# Patient Record
Sex: Female | Born: 1955 | Race: Black or African American | Hispanic: No | Marital: Married | Smoking: Former smoker
Health system: Southern US, Community
[De-identification: ages and names within clinical notes are randomized; demographics above are authoritative.]

## PROBLEM LIST (undated history)

## (undated) DIAGNOSIS — I10 Essential (primary) hypertension: Secondary | ICD-10-CM

## (undated) HISTORY — PX: TUBAL LIGATION: SHX77

## (undated) HISTORY — DX: Essential (primary) hypertension: I10

---

## 2017-01-07 ENCOUNTER — Encounter: Payer: Self-pay | Admitting: *Deleted

## 2017-01-11 ENCOUNTER — Ambulatory Visit (INDEPENDENT_AMBULATORY_CARE_PROVIDER_SITE_OTHER): Payer: Self-pay | Admitting: Obstetrics & Gynecology

## 2017-01-11 ENCOUNTER — Encounter: Payer: Self-pay | Admitting: Obstetrics & Gynecology

## 2017-01-11 ENCOUNTER — Encounter (INDEPENDENT_AMBULATORY_CARE_PROVIDER_SITE_OTHER): Payer: Self-pay

## 2017-01-11 VITALS — BP 100/60 | HR 66 | Wt 189.0 lb

## 2017-01-11 DIAGNOSIS — N9089 Other specified noninflammatory disorders of vulva and perineum: Secondary | ICD-10-CM

## 2017-01-11 NOTE — Progress Notes (Signed)
      Chief Complaint  Patient presents with  . Referral    vaginal lesion    Blood pressure 100/60, pulse 66, weight 189 lb (85.7 kg).  61 y.o. G2P2 No LMP recorded. The current method of family planning is tubal ligation + menopause.  Outpatient Encounter Prescriptions as of 01/11/2017  Medication Sig  . lisinopril-hydrochlorothiazide (PRINZIDE,ZESTORETIC) 20-25 MG tablet Take 1 tablet by mouth daily.   No facility-administered encounter medications on file as of 01/11/2017.     Subjective Martha Li presents today with about a 3 week history of a left vaginal or vulvar lesion that was examined prior primary care doctor, Dr. Alvina Filbert She states that it hurts so bad that she hardly could walk She's had no drainage and never had a lesion like it before She is not diabetic Patient states that it didn't yet she didn't burn it was just tender  Objective General WDWN female NAD Vulva:  normal appearing vulva with no masses, tenderness or lesions, no swelling erythema tenderness or leukoplakia is seen on exam today I had the patient examined area herself and she said whatever Ariel it was involved has now resolved Vagina:  normal mucosa, no discharge Cervix:  no lesions Uterus:  normal size, contour, position, consistency, mobility, non-tender Adnexa: ovaries:present,     Pertinent ROS No burning with urination, frequency or urgency No nausea, vomiting or diarrhea Nor fever chills or other constitutional symptoms   Labs or studies No new     Impression Diagnoses this Encounter::   ICD-10-CM   1. Vulvar lesion N90.89    resolved on exam today    Established relevant diagnosis(es):   Plan/Recommendations: No orders of the defined types were placed in this encounter.   Labs or Scans Ordered: No orders of the defined types were placed in this encounter.   Management:: Patient instructed if it returns to please call and we will see her soon as  possible She has any other issues that need evaluation she is certainly welcome to give [place] a call  Follow up Return if symptoms worsen or fail to improve.     All questions were answered.  Past Medical History:  Diagnosis Date  . Hypertension     Past Surgical History:  Procedure Laterality Date  . TUBAL LIGATION      OB History    Gravida Para Term Preterm AB Living   2 2           SAB TAB Ectopic Multiple Live Births                  No Known Allergies  Social History   Social History  . Marital status: Married    Spouse name: N/A  . Number of children: N/A  . Years of education: N/A   Social History Main Topics  . Smoking status: Former Games developer  . Smokeless tobacco: Former Neurosurgeon  . Alcohol use No  . Drug use: No  . Sexual activity: Not Currently   Other Topics Concern  . None   Social History Narrative  . None    Family History  Problem Relation Age of Onset  . Hypertension Father   . Hypertension Mother   . Diabetes Mother   . Hypertension Brother   . Hypertension Sister

## 2018-07-16 ENCOUNTER — Ambulatory Visit: Payer: Self-pay

## 2018-12-09 ENCOUNTER — Other Ambulatory Visit: Payer: Self-pay

## 2018-12-10 ENCOUNTER — Encounter: Payer: Self-pay | Admitting: *Deleted

## 2018-12-10 ENCOUNTER — Other Ambulatory Visit: Payer: Self-pay | Admitting: *Deleted

## 2018-12-10 ENCOUNTER — Other Ambulatory Visit: Payer: Self-pay

## 2018-12-10 ENCOUNTER — Ambulatory Visit: Payer: Self-pay | Attending: Oncology | Admitting: *Deleted

## 2018-12-10 ENCOUNTER — Ambulatory Visit
Admission: RE | Admit: 2018-12-10 | Discharge: 2018-12-10 | Disposition: A | Discharge status: Home / Self Care | Payer: Self-pay | Source: Ambulatory Visit | Attending: Oncology | Admitting: Oncology

## 2018-12-10 VITALS — BP 145/86 | HR 65 | Temp 96.6°F | Ht 66.0 in | Wt 196.0 lb

## 2018-12-10 DIAGNOSIS — Z0000 Encounter for general adult medical examination without abnormal findings: Secondary | ICD-10-CM

## 2018-12-10 DIAGNOSIS — N63 Unspecified lump in unspecified breast: Secondary | ICD-10-CM

## 2018-12-10 NOTE — Progress Notes (Signed)
  Subjective:     Patient ID: Martha Li, female   DOB: May 22, 1955, 63 y.o.   MRN: [id number]  HPI   Review of Systems     Objective:   Physical Exam Chest:     Breasts:        Right: No swelling, bleeding, inverted nipple, mass, nipple discharge, skin change or tenderness.        Left: No swelling, bleeding, inverted nipple, mass, nipple discharge, skin change or tenderness.    Lymphadenopathy:     Upper Body:     Right upper body: No supraclavicular or axillary adenopathy.     Left upper body: No supraclavicular or axillary adenopathy.        Assessment:     63 year old Black female presents to [facility] via referral from [facility] for clinical breast exam and mammogram.  Last pap was in 2018. Next pap due per results.  Clinical breast exam unremarkable.  Taught self breast awareness.  This will be the patient's first mammogram.  Her sister has been diagnosed with breast cancer.  Patient has been screened for eligibility.  She does not have any insurance, Medicare or Medicaid.  She also meets financial eligibility.  Hand-out given on the Affordable Care Act.  Risk Assessment    Risk Scores      12/10/2018   Last edited by: Theodore Demark, RN   5-year risk: 2.6 %   Lifetime risk: 9.7 %            Plan:     Screenigng mammogram ordered.  Will follow up per [facility] protocol.

## 2019-01-08 ENCOUNTER — Ambulatory Visit: Payer: Self-pay

## 2019-01-15 ENCOUNTER — Ambulatory Visit
Admission: RE | Admit: 2019-01-15 | Discharge: 2019-01-15 | Disposition: A | Discharge status: Home / Self Care | Payer: Self-pay | Source: Ambulatory Visit | Attending: Oncology | Admitting: Oncology

## 2019-01-15 DIAGNOSIS — N63 Unspecified lump in unspecified breast: Secondary | ICD-10-CM

## 2019-01-16 ENCOUNTER — Other Ambulatory Visit: Payer: Self-pay

## 2019-01-16 DIAGNOSIS — R920 Mammographic microcalcification found on diagnostic imaging of breast: Secondary | ICD-10-CM

## 2019-01-30 ENCOUNTER — Encounter: Payer: Self-pay | Admitting: *Deleted

## 2019-01-30 NOTE — Progress Notes (Signed)
Patient has not been scheduled for her breast biopsy.  I called patient and she did not think she needed to have anything else done.  She agrees to have a biopsy.  I left a message with Aldona Bar in the breast center to call and schedule the patient.  I also gave the patient the number to the breast center.

## 2019-03-18 ENCOUNTER — Encounter: Payer: Self-pay | Admitting: *Deleted

## 2019-03-18 NOTE — Progress Notes (Signed)
Patient did not schedule her breast biopsy after our last conversation.  The [facility] did send a certified letter to the patient on 02/23/19.  Will close case as refusal to follow up.  I will also send her primary care provider a message that the patient never had her breast biopsy.

## 2020-07-19 IMAGING — US US BREAST*L* LIMITED INC AXILLA
1 series · 12 of 12 positions shown · non-contrast
Comparison: Previous exam(s).

CLINICAL DATA: Screening recall for right breast asymmetry with
calcifications, additional calcifications in the central posterior
right breast as well as 2 masses in the left breast.

EXAM:
DIGITAL DIAGNOSTIC BILATERAL MAMMOGRAM WITH CAD AND TOMO
BILATERAL BREAST ULTRASOUND

[Series 1: us breast*left* limited inc axilla · 0.06mm/px · 12 of 12 slices shown]
[im 1/12]
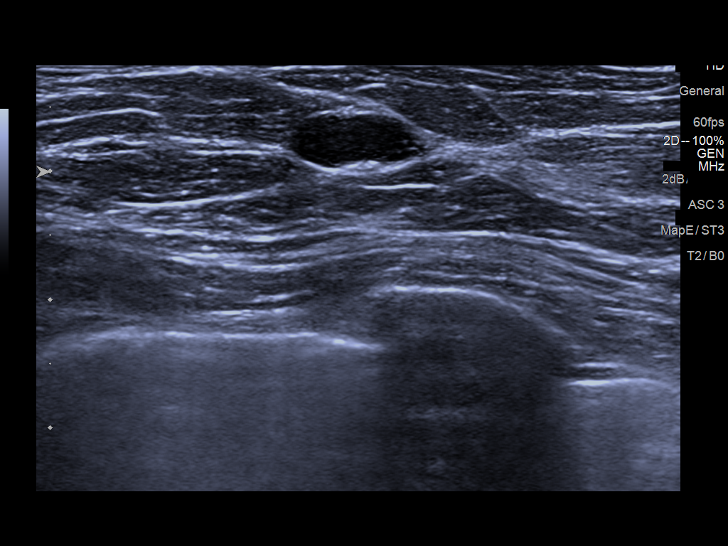
[im 2/12]
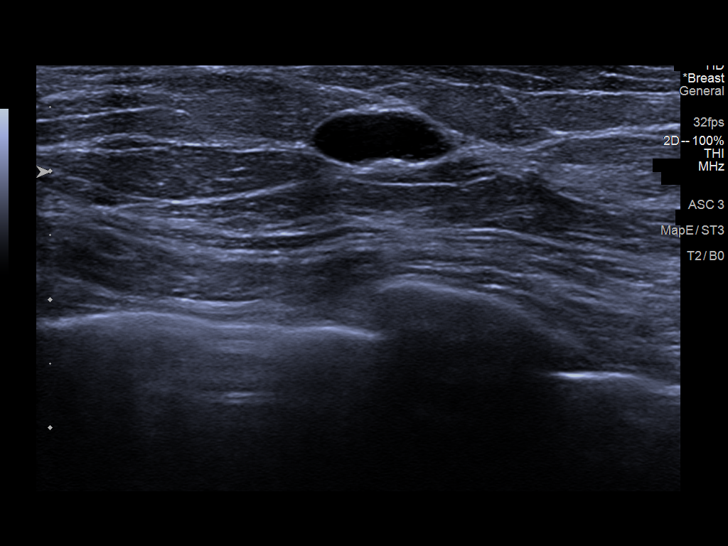
[im 3/12]
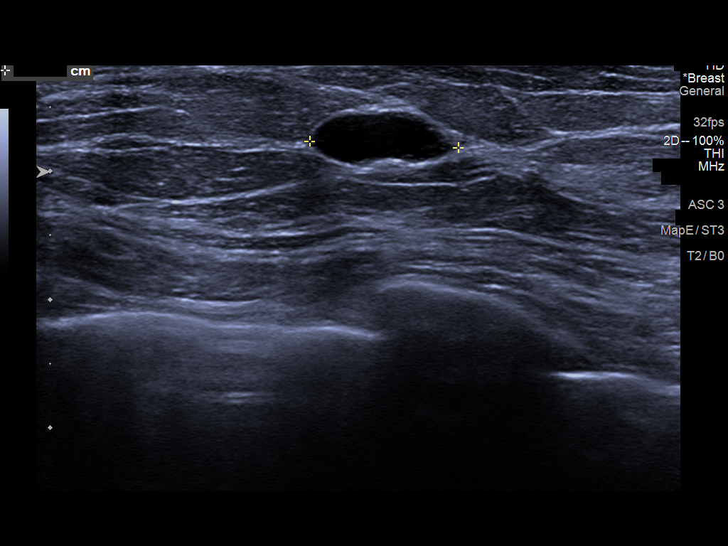
[im 4/12]
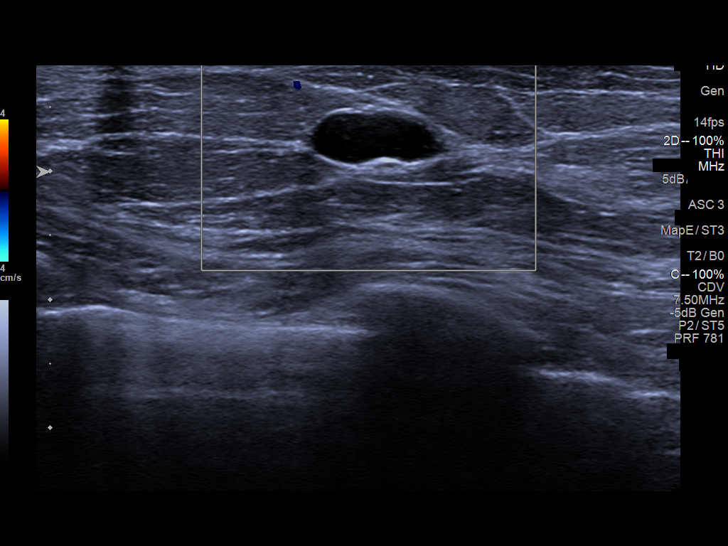
[im 5/12]
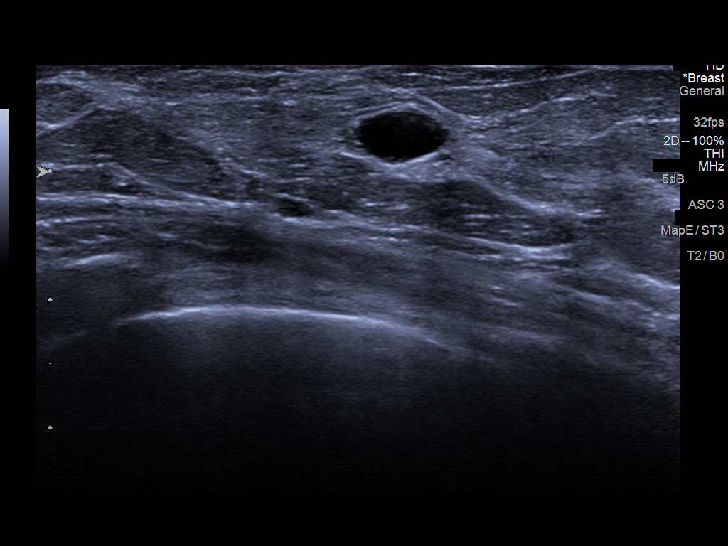
[im 6/12]
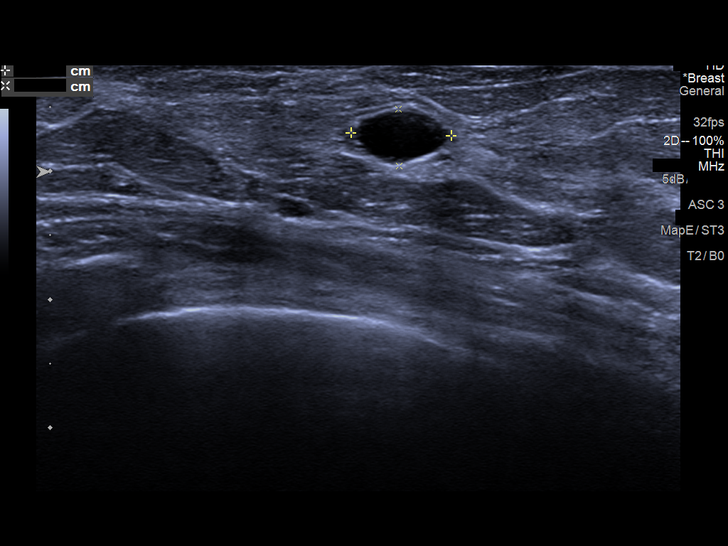
[im 7/12]
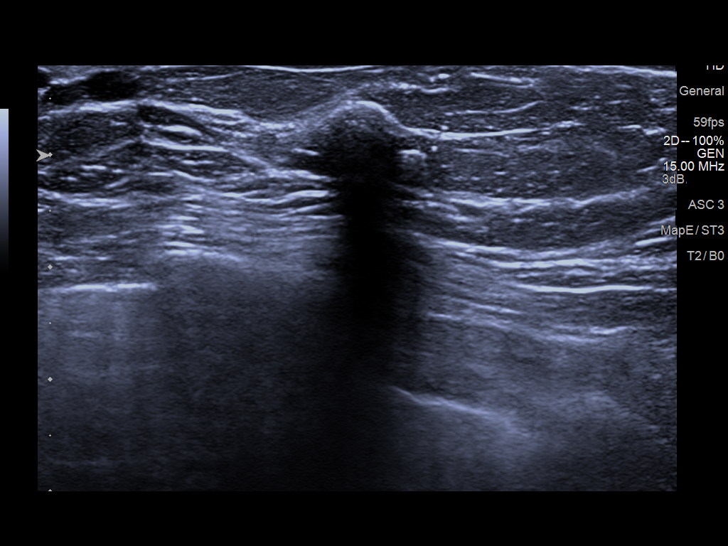
[im 8/12]
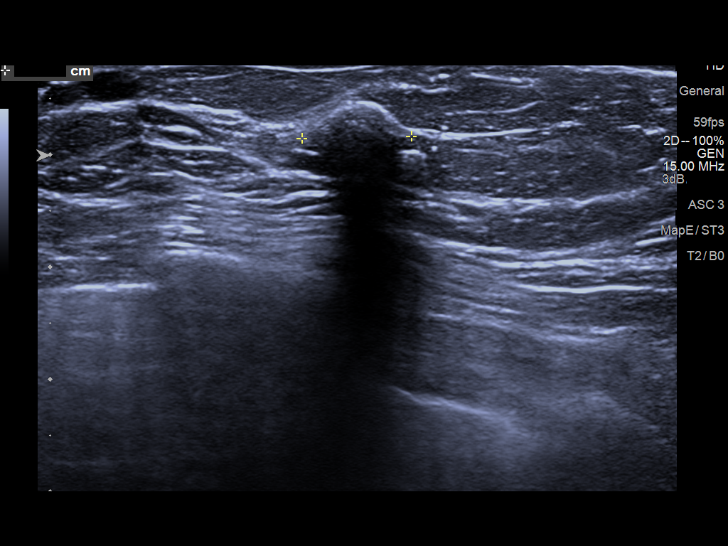
[im 9/12]
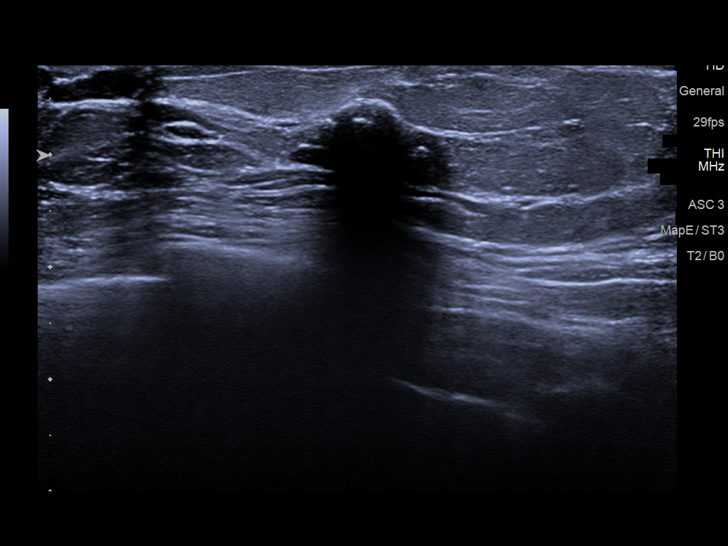
[im 10/12]
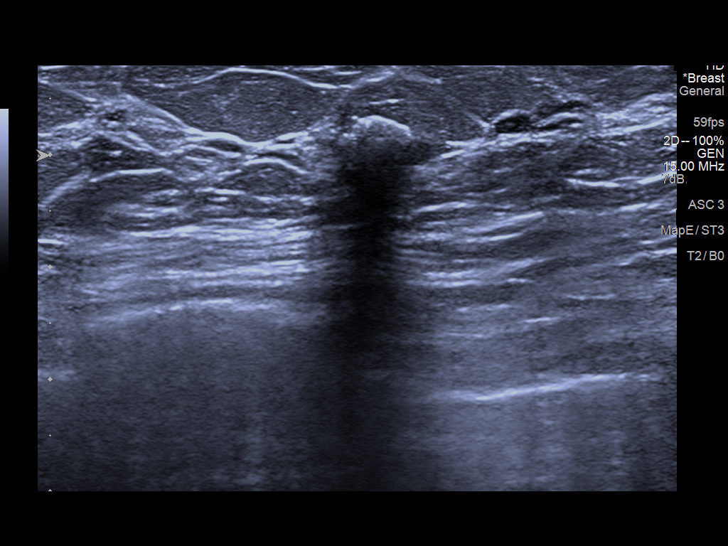
[im 11/12]
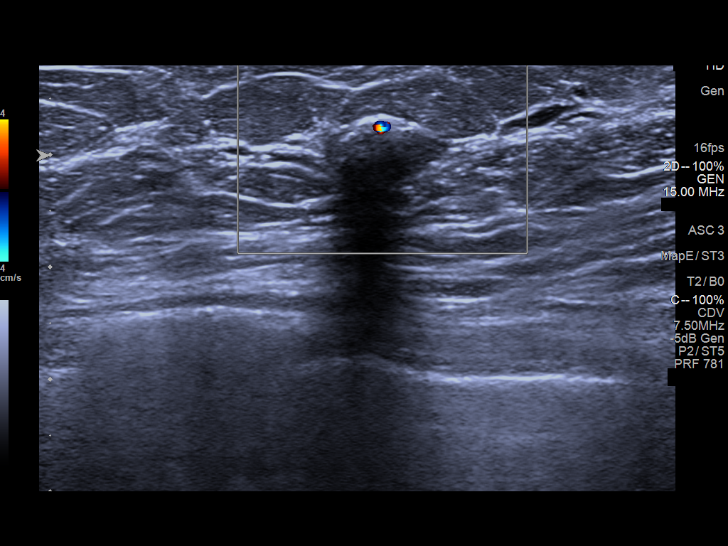
[im 12/12]
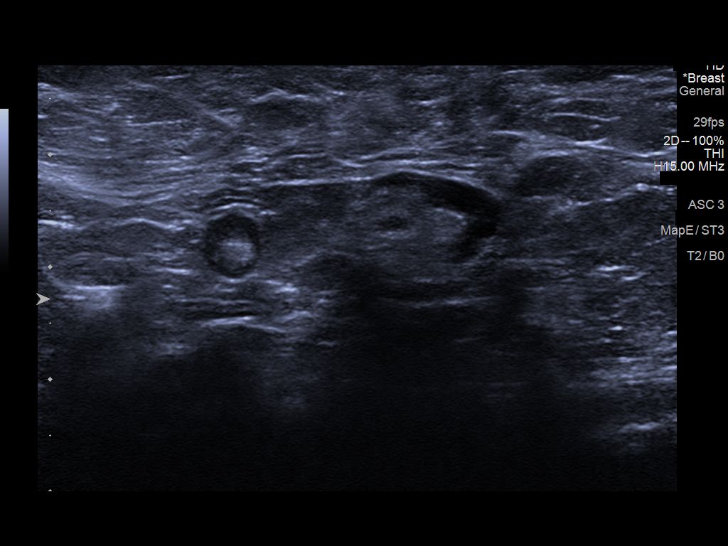

[12 of 12 positions shown; findings below may reference images not displayed]

ACR Breast Density Category b: There are scattered areas of
fibroglandular density.
FINDINGS: Spot compression magnification views were performed of the right
breast. There is a large group of coarse heterogeneous
calcifications with associated density predominantly involving the
central upper right breast and measuring 10 cm. In addition, there
is a small 1 cm group of coarse heterogeneous calcifications in the
lower outer right breast posterior depth.

Additional tomograms were performed of the left breast. There is an
oval circumscribed mass in the upper inner left breast measuring
approximately 1 cm. The additional additionally questioned possible
mass seen in the lower anterior left breast on the MLO view only is
not definitely identified and may be related to overlapping
structures or a dilated duct.

Mammographic images were processed with CAD.

Physical examination of the central right breast does not reveal any
palpable masses.

Targeted ultrasound of the entire central and upper right breast was
performed. There is dense fibroglandular tissue with scattered
calcifications corresponding to the calcifications and density seen
in the right breast at mammography. No discrete suspicious masses
identified. No right axillary lymphadenopathy.

Targeted ultrasound of the left breast was performed. There is a
cyst at 10 o'clock 5 cm from nipple measuring 1.2 x 0.4 x 0.8 cm.
This corresponds well with the mass seen in the upper inner left
breast at mammography. The central/retroareolar and inner left
breast was scanned with no additional abnormality seen. Shadowing
coarse calcifications are seen at 3 o'clock retroareolar
corresponding to benign calcifications seen in the left breast at
mammography. No left axillary lymphadenopathy.
IMPRESSION: 1. Suspicious coarse heterogeneous calcifications involving the
central anterior right breast with associated density spanning
nearly 10 cm.

2. Smaller 1 cm group of coarse heterogeneous calcifications in the
lower outer posterior right breast.

3.  No findings of malignancy in the left breast.

RECOMMENDATION:
1. Stereotactic guided biopsy of the coarse heterogeneous
calcifications in the central anterior right breast.

2. Stereotactic guided biopsy of the smaller 1 cm group of coarse
heterogeneous calcifications in the lower outer posterior right
breast.

I have discussed the findings and recommendations with the patient.
If applicable, a reminder letter will be sent to the patient
regarding the next appointment.

BI-RADS CATEGORY  4: Suspicious.

## 2020-09-28 IMAGING — MG DIGITAL SCREENING BILATERAL MAMMOGRAM WITH TOMO AND CAD
8 series · 8 of 24 positions shown · non-contrast
Comparison: None.

CLINICAL DATA: Screening.

EXAM:
DIGITAL SCREENING BILATERAL MAMMOGRAM WITH TOMO AND CAD

[L CC synth-2D]
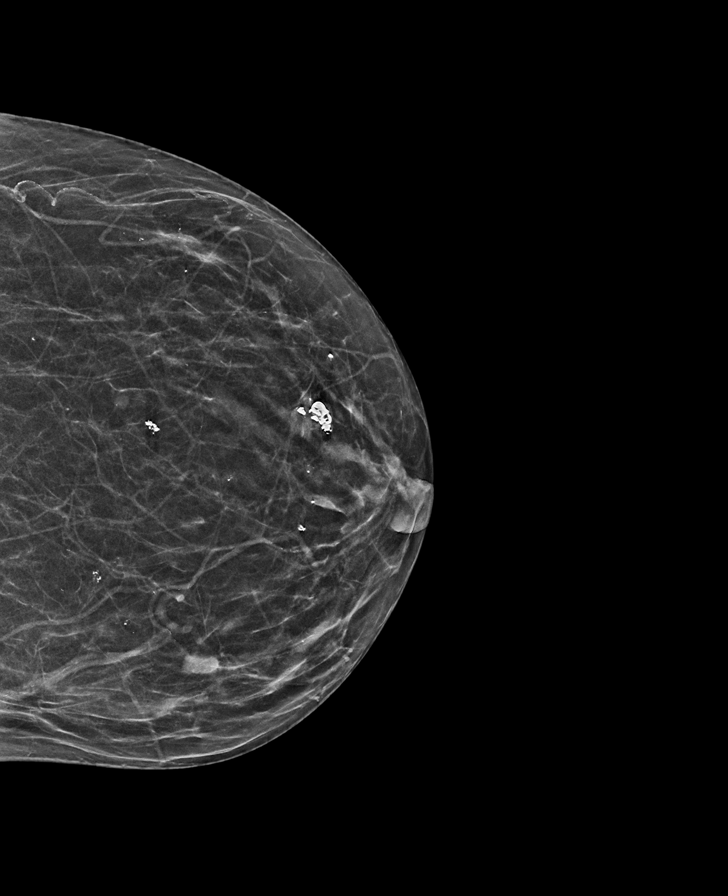

[L MLO synth-2D]
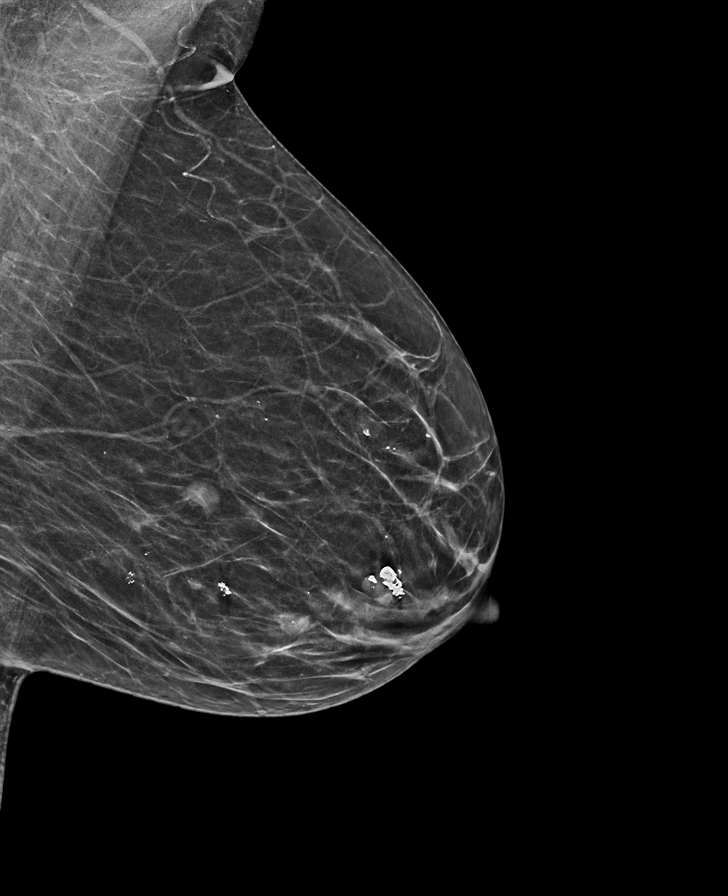

[R CC synth-2D]
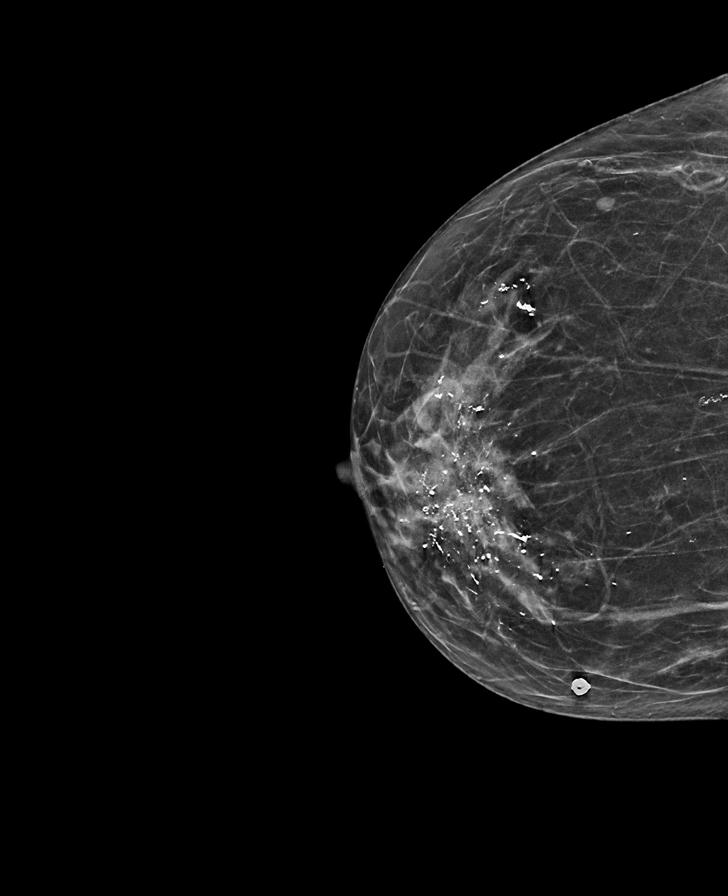

[R MLO synth-2D]
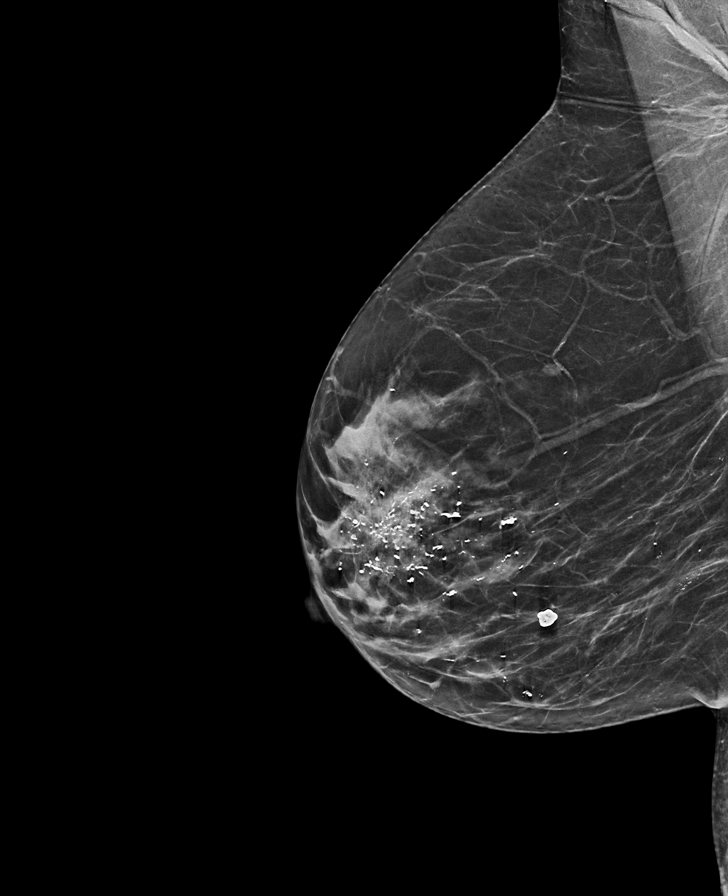

[R MLO tomo · tomo slice 29/58.0]
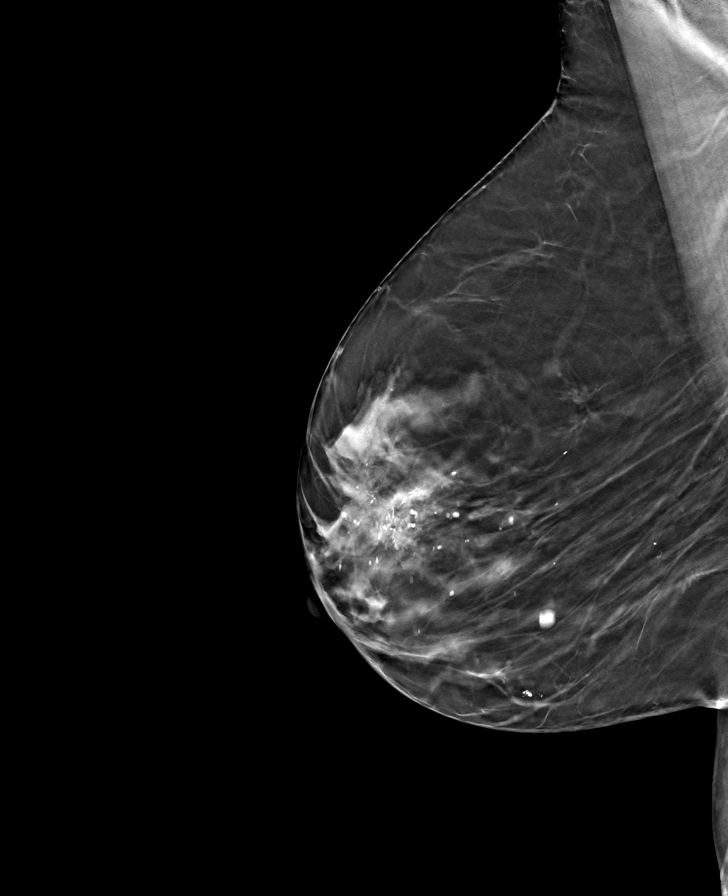

[R CC tomo · tomo slice 26/51.0]
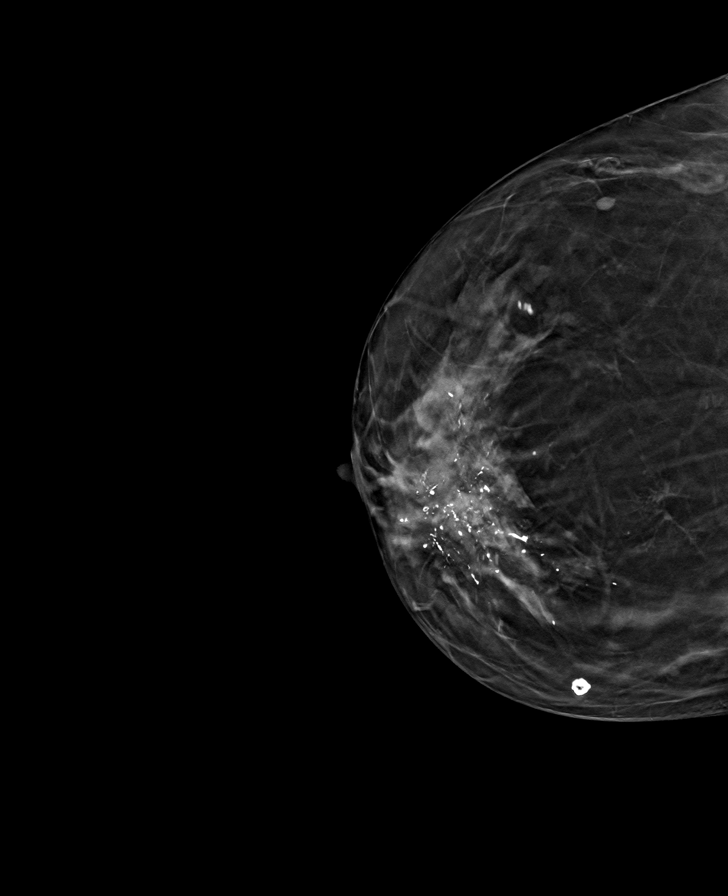

[L CC tomo · tomo slice 25/48.0]
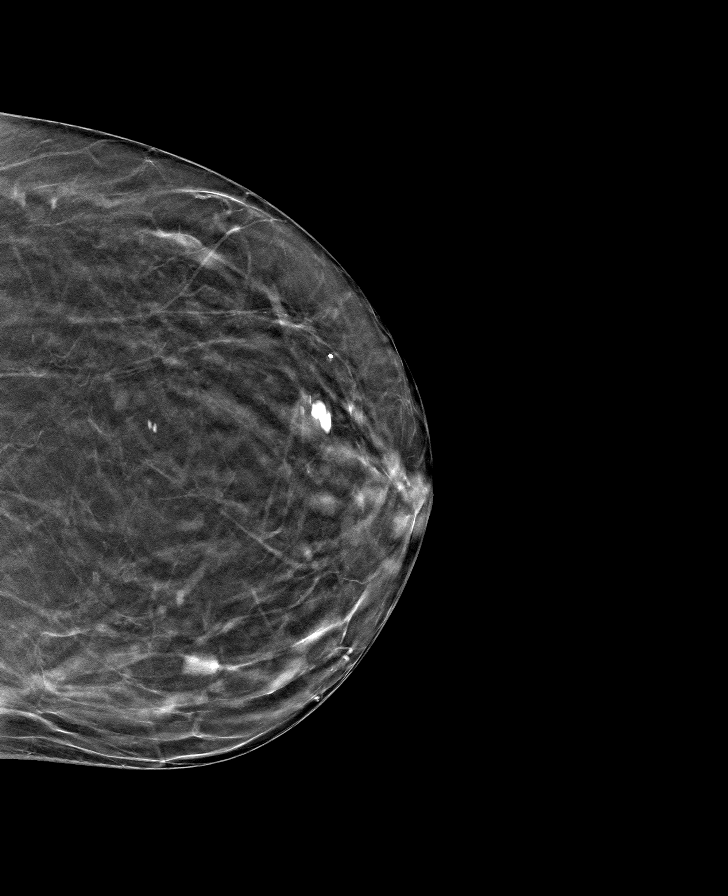

[L MLO tomo · tomo slice 27/53.0]
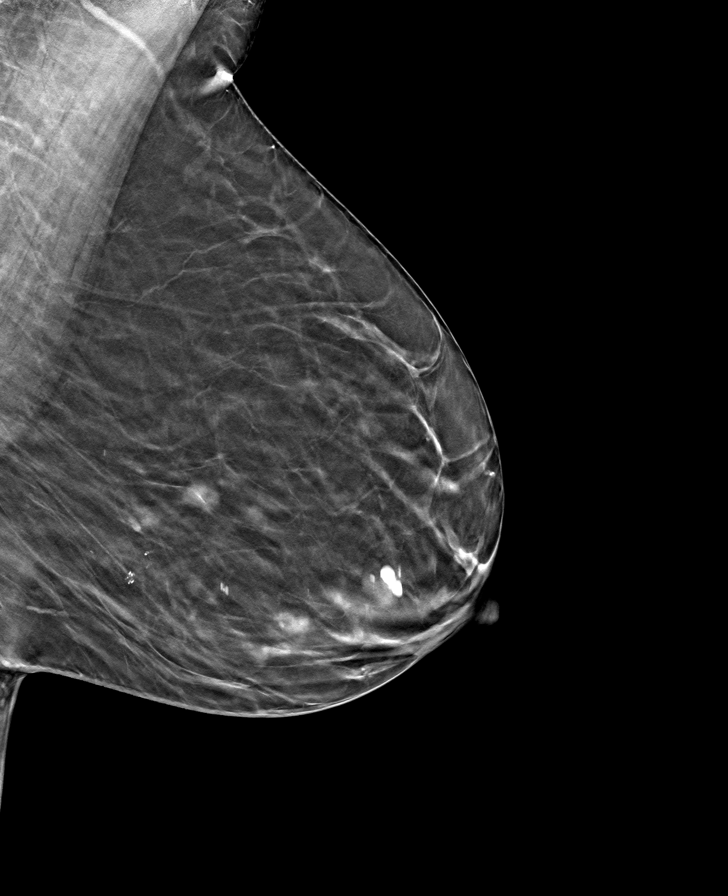

[8 of 24 positions shown; findings below may reference images not displayed]

ACR Breast Density Category b: There are scattered areas of
fibroglandular density.
FINDINGS: In the right breast an asymmetry with calcifications in the anterior
right breast and a second area of calcifications in the central
posterior right breast requires further evaluation.

In the left breast 2 possible masses requires further evaluation.

Images were processed with CAD.
IMPRESSION: Further evaluation is suggested for possible asymmetry with
calcifications and a separate group of calcifications in the right
breast.

Further evaluation is suggested for 2 possible masses in the left
breast.

RECOMMENDATION:
Diagnostic mammogram and possibly ultrasound of both breasts.
(Code:[id number])

The patient will be contacted regarding the findings, and additional
imaging will be scheduled.

BI-RADS CATEGORY  0: Incomplete. Need additional imaging evaluation
and/or prior mammograms for comparison.
